# Patient Record
Sex: Male | Born: 1985 | Race: Black or African American | Hispanic: No | Marital: Single | Smoking: Never smoker
Health system: Southern US, Community
[De-identification: ages and names within clinical notes are randomized; demographics above are authoritative.]

## PROBLEM LIST (undated history)

## (undated) ENCOUNTER — Ambulatory Visit: Discharge status: Absent without leave | Payer: 59

## (undated) HISTORY — PX: WISDOM TOOTH EXTRACTION: SHX21

## (undated) HISTORY — PX: ARTHROSCOPIC REPAIR ACL: SUR80

---

## 2016-03-23 DIAGNOSIS — G510 Bell's palsy: Secondary | ICD-10-CM

## 2016-03-23 HISTORY — DX: Bell's palsy: G51.0

## 2017-01-08 DIAGNOSIS — M545 Low back pain: Secondary | ICD-10-CM | POA: Diagnosis not present

## 2017-01-08 DIAGNOSIS — M5432 Sciatica, left side: Secondary | ICD-10-CM | POA: Diagnosis not present

## 2017-06-05 ENCOUNTER — Ambulatory Visit (HOSPITAL_COMMUNITY)
Admission: EM | Admit: 2017-06-05 | Discharge: 2017-06-05 | Disposition: A | Discharge status: Home / Self Care | Payer: 59 | Attending: Family Medicine | Admitting: Family Medicine

## 2017-06-05 ENCOUNTER — Other Ambulatory Visit: Payer: Self-pay

## 2017-06-05 ENCOUNTER — Encounter (HOSPITAL_COMMUNITY): Payer: Self-pay

## 2017-06-05 DIAGNOSIS — J029 Acute pharyngitis, unspecified: Secondary | ICD-10-CM

## 2017-06-05 DIAGNOSIS — R509 Fever, unspecified: Secondary | ICD-10-CM | POA: Diagnosis present

## 2017-06-05 LAB — POCT RAPID STREP A: STREPTOCOCCUS, GROUP A SCREEN (DIRECT): NEGATIVE

## 2017-06-05 MED ORDER — ACETAMINOPHEN 325 MG PO TABS: 650.0000 mg | ORAL_TABLET | Freq: Once | ORAL | Status: DC

## 2017-06-05 MED ORDER — AMOXICILLIN 875 MG PO TABS: 875.0000 mg | ORAL_TABLET | Freq: Two times a day (BID) | ORAL | 0 refills | Status: DC

## 2017-06-05 NOTE — Discharge Instructions (Signed)
Please follow up if you are not seeing improvement within the next few days.  You may use over the counter ibuprofen or acetaminophen as needed.

## 2017-06-05 NOTE — ED Triage Notes (Signed)
Pt presents today with fever, chills, body aches, sinus pressure, sore throat that has been going on for 8 days. Has tried OTC that has not helped much with sxs.

## 2017-06-08 ENCOUNTER — Encounter (HOSPITAL_COMMUNITY): Payer: Self-pay | Admitting: Emergency Medicine

## 2017-06-08 ENCOUNTER — Ambulatory Visit (HOSPITAL_COMMUNITY)
Admission: EM | Admit: 2017-06-08 | Discharge: 2017-06-08 | Disposition: A | Discharge status: Home / Self Care | Payer: 59 | Attending: Family Medicine | Admitting: Family Medicine

## 2017-06-08 DIAGNOSIS — B370 Candidal stomatitis: Secondary | ICD-10-CM | POA: Diagnosis not present

## 2017-06-08 DIAGNOSIS — K0510 Chronic gingivitis, plaque induced: Secondary | ICD-10-CM

## 2017-06-08 LAB — CULTURE, GROUP A STREP (THRC)

## 2017-06-08 MED ORDER — HYDROCODONE-ACETAMINOPHEN 5-325 MG PO TABS: 1.0000 | ORAL_TABLET | Freq: Four times a day (QID) | ORAL | 0 refills | Status: DC | PRN

## 2017-06-08 MED ORDER — CHLORHEXIDINE GLUCONATE 0.12 % MT SOLN: 15.0000 mL | Freq: Two times a day (BID) | OROMUCOSAL | 0 refills | Status: DC

## 2017-06-08 MED ORDER — FLUCONAZOLE 150 MG PO TABS: 150.0000 mg | ORAL_TABLET | Freq: Once | ORAL | 0 refills | Status: AC

## 2017-06-08 NOTE — ED Provider Notes (Signed)
[facility]   [id number] 06/08/17 Arrival Time: 1808   SUBJECTIVE:  William Newton is a 32 y.o. male who presents to the urgent care with complaint of mouth sores.  History reviewed. No pertinent past medical history. History reviewed. No pertinent family history. Social History   Socioeconomic History  . Marital status: Single    Spouse name: Not on file  . Number of children: Not on file  . Years of education: Not on file  . Highest education level: Not on file  Social Needs  . Financial resource strain: Not on file  . Food insecurity - worry: Not on file  . Food insecurity - inability: Not on file  . Transportation needs - medical: Not on file  . Transportation needs - non-medical: Not on file  Occupational History  . Not on file  Tobacco Use  . Smoking status: Never Smoker  . Smokeless tobacco: Never Used  Substance and Sexual Activity  . Alcohol use: No    Frequency: Never  . Drug use: No  . Sexual activity: Not on file  Other Topics Concern  . Not on file  Social History Narrative  . Not on file   No outpatient medications have been marked as taking for the 06/08/17 encounter [facility] Encounter).   No Known Allergies    ROS: As per HPI, remainder of ROS negative.   OBJECTIVE:   Vitals:   06/08/17 1847  BP: 130/87  Pulse: 91  Resp: 18  Temp: 99.7 F (37.6 C)  TempSrc: Oral  SpO2: 99%     General appearance: alert; no distress Eyes: PERRL; EOMI; conjunctiva normal HENT: normocephalic; atraumatic; TMs normal, canal normal, external ears normal without trauma; nasal mucosa normal; oral mucosa diffuse mucosal injection at gum line of teeth.  Palate also very red. Neck: supple Back: no CVA tenderness Extremities: no cyanosis or edema; symmetrical with no gross deformities Skin: warm and dry Neurologic: normal gait; grossly normal Psychological: alert and cooperative; normal mood and affect      Labs:  Results for orders  placed or performed during the hospital encounter of 06/05/17  Culture, group A strep  Result Value Ref Range   Specimen Description THROAT    Special Requests NONE    Culture      NO GROUP A STREP (S.PYOGENES) ISOLATED Performed at [facility], [address], [place], [place] [address]    Report Status 06/08/2017 FINAL   POCT rapid strep A [facility])  Result Value Ref Range   Streptococcus, Group A Screen (Direct) NEGATIVE NEGATIVE    Labs Reviewed - No data to display  No results found.     ASSESSMENT & PLAN:  1. Gingivitis   2. Thrush     Meds ordered this encounter  Medications  . fluconazole (DIFLUCAN) 150 MG tablet    Sig: Take 1 tablet (150 mg total) by mouth once for 1 dose. Repeat if needed    Dispense:  2 tablet    Refill:  0  . chlorhexidine (PERIDEX) 0.12 % solution    Sig: Use as directed 15 mLs in the mouth or throat 2 (two) times daily.    Dispense:  120 mL    Refill:  0  . HYDROcodone-acetaminophen (NORCO) 5-325 MG tablet    Sig: Take 1 tablet by mouth every 6 (six) hours as needed for moderate pain.    Dispense:  12 tablet    Refill:  0  Reviewed expectations re: course of current medical issues. Questions answered. Outlined signs and symptoms indicating need for more acute intervention. Patient verbalized understanding. After Visit Summary given.    Procedures:      Elvina SidleLauenstein, Stefany Starace, MD 06/08/17 1859

## 2017-06-08 NOTE — ED Triage Notes (Signed)
Pt seen here for strep last week now sts sores in mouth

## 2017-06-09 NOTE — ED Provider Notes (Signed)
  [facility]   [id number] 06/05/17 Arrival Time: 1758  ASSESSMENT & PLAN:  1. Sore throat   2. Fever and chills     Meds ordered this encounter  Medications  . acetaminophen (TYLENOL) tablet 650 mg  . amoxicillin (AMOXIL) 875 MG tablet    Sig: Take 1 tablet (875 mg total) by mouth 2 (two) times daily for 10 days.    Dispense:  20 tablet    Refill:  0   Rapid strep negative. Culture sent. Suspicion for strep; will treat.  Labs Reviewed  CULTURE, GROUP A STREP [facility])  POCT RAPID STREP A   OTC analgesics and throat care as needed  Instructed to finish full 10 day course of antibiotics. Will follow up if not showing significant improvement over the next 24-48 hours.  Reviewed expectations re: course of current medical issues. Questions answered. Outlined signs and symptoms indicating need for more acute intervention. Patient verbalized understanding. After Visit Summary given.   SUBJECTIVE:  William Newton is a 32 y.o. male who reports a sore throat. Describes as pain with swallowing. Onset abrupt beginning several days ago. No respiratory symptoms. Normal PO intake but reports discomfort with swallowing. Fever reported: yes, subjective. No associated n/v/abdominal symptoms. Sick contacts: none. Also with sinus pressure for the past 7-10 days.  OTC treatment: analgesics without much relief.  ROS: As per HPI.   OBJECTIVE:  Vitals:   06/05/17 1849  BP: 135/72  Pulse: (!) 102  Resp: 16  Temp: (!) 101.5 F (38.6 C)  TempSrc: Oral  SpO2: 96%    Febrile. General appearance: alert; no distress HEENT: throat with moderate erythema; uvula midline Neck: supple with FROM;  Small cervical LAD, tender Lungs: clear to auscultation bilaterally Skin: reveals no rash; warm and dry Psychological: alert and cooperative; normal mood and affect  No Known Allergies   Social History   Socioeconomic History  . Marital status: Single    Spouse name: Not on file    . Number of children: Not on file  . Years of education: Not on file  . Highest education level: Not on file  Social Needs  . Financial resource strain: Not on file  . Food insecurity - worry: Not on file  . Food insecurity - inability: Not on file  . Transportation needs - medical: Not on file  . Transportation needs - non-medical: Not on file  Occupational History  . Not on file  Tobacco Use  . Smoking status: Never Smoker  . Smokeless tobacco: Never Used  Substance and Sexual Activity  . Alcohol use: No    Frequency: Never  . Drug use: No  . Sexual activity: Not on file  Other Topics Concern  . Not on file  Social History Narrative  . Not on file           Mardella Layman, MD 06/09/17 1010

## 2019-03-15 ENCOUNTER — Ambulatory Visit
Admission: EM | Admit: 2019-03-15 | Discharge: 2019-03-15 | Disposition: A | Discharge status: Home / Self Care | Payer: 59 | Attending: Emergency Medicine | Admitting: Emergency Medicine

## 2019-03-15 ENCOUNTER — Other Ambulatory Visit: Payer: Self-pay

## 2019-03-15 ENCOUNTER — Encounter: Payer: Self-pay | Admitting: Emergency Medicine

## 2019-03-15 DIAGNOSIS — S0501XA Injury of conjunctiva and corneal abrasion without foreign body, right eye, initial encounter: Secondary | ICD-10-CM | POA: Diagnosis not present

## 2019-03-15 DIAGNOSIS — Z973 Presence of spectacles and contact lenses: Secondary | ICD-10-CM | POA: Diagnosis not present

## 2019-03-15 MED ORDER — CIPROFLOXACIN HCL 0.3 % OP SOLN: 1.0000 [drp] | OPHTHALMIC | 0 refills | Status: AC

## 2019-03-15 NOTE — ED Provider Notes (Signed)
[facility]    CSN: [id number] Arrival date & time: 03/15/19  1120      History   Chief Complaint Chief Complaint  Patient presents with  . Eye Pain    HPI William Newton is a 33 y.o. male presenting for right eye pain, irritation, redness for the last few days, worse this morning.  Patient states he had some discharge that appeared to be green and kicked over his eye this morning.  Denies mucoid discharge outside of awakening.  Does endorse some clear, watery tearing, photophobia.  No change in vision.  Patient does wear contact lenses, does not currently have glasses.  Is not currently wearing contact lenses today.  Denies foreign body sensation, known foreign body exposure.  Dates that he changes his contact lenses nightly, follows appropriate contact lens hygiene.  Has not tried anything for symptoms.    History reviewed. No pertinent past medical history.  There are no problems to display for this patient.   Past Surgical History:  Procedure Laterality Date  . ARTHROSCOPIC REPAIR ACL    . WISDOM TOOTH EXTRACTION         Home Medications    Prior to Admission medications   Medication Sig Start Date End Date Taking? Authorizing Provider  ciprofloxacin (CILOXAN) 0.3 % ophthalmic solution Place 1 drop into the right eye every 2 (two) hours. Administer 1 drop, every 2 hours, while awake, for 2 days. Then 1 drop, every 4 hours, while awake, for the next 5 days. 03/15/19   Hall-Potvin, [place], PA-C    Family History Family History  Problem Relation Age of Onset  . Healthy Mother   . Sarcoidosis Father     Social History Social History   Tobacco Use  . Smoking status: Never Smoker  . Smokeless tobacco: Never Used  Substance Use Topics  . Alcohol use: No  . Drug use: No     Allergies   Patient has no known allergies.   Review of Systems Review of Systems  Constitutional: Negative for fatigue and fever.  Eyes: Positive for photophobia,  pain, discharge and redness. Negative for itching and visual disturbance.  Respiratory: Negative for cough and shortness of breath.   Cardiovascular: Negative for chest pain and palpitations.  Gastrointestinal: Negative for abdominal pain, diarrhea and vomiting.  Musculoskeletal: Negative for arthralgias and myalgias.  Skin: Negative for rash and wound.  Neurological: Negative for speech difficulty and headaches.  All other systems reviewed and are negative.    Physical Exam Triage Vital Signs ED Triage Vitals [03/15/19 1129]  Enc Vitals Group     BP 123/82     Pulse Rate 77     Resp 18     Temp 97.6 F (36.4 C)     Temp Source Temporal     SpO2 95 %     Weight      Height      Head Circumference      Peak Flow      Pain Score 7     Pain Loc      Pain Edu?      Excl. in GC?    No data found.  Updated Vital Signs BP 123/82 (BP Location: Left Arm)   Pulse 77   Temp 97.6 F (36.4 C) (Temporal)   Resp 18   SpO2 95%   Visual Acuity Right Eye Distance:   Left Eye Distance:   Bilateral Distance:    Right Eye Near: R Near:  20/200 Left Eye Near:  L Near: 20/200 Bilateral Near:  20/200(Pt is not wearing his contacts at this time)  Physical Exam Constitutional:      General: He is not in acute distress. HENT:     Head: Normocephalic and atraumatic.  Eyes:     General: Lids are normal. Lids are everted, no foreign bodies appreciated. Gaze aligned appropriately. No visual field deficit or scleral icterus.       Right eye: Discharge present. No foreign body or hordeolum.        Left eye: No foreign body, discharge or hordeolum.     Extraocular Movements: Extraocular movements intact.     Right eye: No nystagmus.     Left eye: No nystagmus.     Conjunctiva/sclera:     Right eye: Right conjunctiva is injected. No chemosis, exudate or hemorrhage.    Left eye: Left conjunctiva is not injected. No chemosis, exudate or hemorrhage.    Pupils: Pupils are equal, round, and  reactive to light.     Comments: Right eye with clear watery discharge.  No mucoid discharge medial canthus.  Vision is poor, though uncorrected and bilaterally symmetric.  Fluorescein dye exam showing corneal abrasion over inferolateral aspect of cornea sparing iris.  No ulceration noted  Cardiovascular:     Rate and Rhythm: Normal rate.  Pulmonary:     Effort: Pulmonary effort is normal. No respiratory distress.     Breath sounds: No wheezing.  Skin:    Coloration: Skin is not jaundiced or pale.  Neurological:     Mental Status: He is alert and oriented to person, place, and time.      UC Treatments / Results  Labs (all labs ordered are listed, but only abnormal results are displayed) Labs Reviewed - No data to display  EKG   Radiology No results found.  Procedures Procedures (including critical care time)  Medications Ordered in UC Medications - No data to display  Initial Impression / Assessment and Plan / UC Course  I have reviewed the triage vital signs and the nursing notes.  Pertinent labs & imaging results that were available during my care of the patient were reviewed by me and considered in my medical decision making (see chart for details).     Patient afebrile, nontoxic.  Exam revealing corneal abrasion, no obvious ulcer.  Giving patient uses contact lenses will cover with ciprofloxacin.  Patient to follow-up with ophthalmology Monday given holiday weekend, go to ER sooner for worsening symptoms.  Return precautions discussed, patient verbalized understanding and is agreeable to plan. Final Clinical Impressions(s) / UC Diagnoses   Final diagnoses:  Abrasion of right cornea, initial encounter  Wears contact lenses     Discharge Instructions     Please read instructions on eyedrop box when you pick up from the pharmacy. Be sure to not let dropper hit your eye or eyelashes as this will contaminate the antibiotic. Very important follow-up with  ophthalmology within 1 week or sooner if you develop worsening pain, fever, decreased vision.    ED Prescriptions    Medication Sig Dispense Auth. Provider   ciprofloxacin (CILOXAN) 0.3 % ophthalmic solution Place 1 drop into the right eye every 2 (two) hours. Administer 1 drop, every 2 hours, while awake, for 2 days. Then 1 drop, every 4 hours, while awake, for the next 5 days. 5 mL Hall-Potvin, [place], PA-C     PDMP not reviewed this encounter.   Hall-Potvin, [place], [place] 03/16/19 1621

## 2019-03-15 NOTE — Discharge Instructions (Addendum)
Please read instructions on eyedrop box when you pick up from the pharmacy. Be sure to not let dropper hit your eye or eyelashes as this will contaminate the antibiotic. Very important follow-up with ophthalmology within 1 week or sooner if you develop worsening pain, fever, decreased vision.

## 2019-03-15 NOTE — ED Triage Notes (Signed)
Pt presents to [facility] for assessment of right eye pain and irritation, worsening this morning with green caked on discharge, watering, and sensitivity to light.

## 2019-08-01 ENCOUNTER — Other Ambulatory Visit: Payer: Self-pay

## 2019-08-02 ENCOUNTER — Encounter: Payer: Self-pay | Admitting: Family Medicine

## 2019-08-02 ENCOUNTER — Ambulatory Visit (INDEPENDENT_AMBULATORY_CARE_PROVIDER_SITE_OTHER): Payer: 59 | Admitting: Family Medicine

## 2019-08-02 VITALS — BP 126/78 | HR 77 | Temp 97.4°F | Ht 70.0 in | Wt 226.2 lb

## 2019-08-02 DIAGNOSIS — Z0000 Encounter for general adult medical examination without abnormal findings: Secondary | ICD-10-CM

## 2019-08-02 DIAGNOSIS — Z8669 Personal history of other diseases of the nervous system and sense organs: Secondary | ICD-10-CM | POA: Diagnosis not present

## 2019-08-02 DIAGNOSIS — G4700 Insomnia, unspecified: Secondary | ICD-10-CM | POA: Diagnosis not present

## 2019-08-02 LAB — URINALYSIS, ROUTINE W REFLEX MICROSCOPIC
Bilirubin Urine: NEGATIVE
Hgb urine dipstick: NEGATIVE
Ketones, ur: NEGATIVE
Leukocytes,Ua: NEGATIVE
Nitrite: NEGATIVE
RBC / HPF: NONE SEEN (ref 0–?)
Specific Gravity, Urine: 1.025 (ref 1.000–1.030)
Total Protein, Urine: NEGATIVE
Urine Glucose: NEGATIVE
Urobilinogen, UA: 0.2 (ref 0.0–1.0)
WBC, UA: NONE SEEN (ref 0–?)
pH: 6 (ref 5.0–8.0)

## 2019-08-02 LAB — LIPID PANEL
Cholesterol: 152 mg/dL (ref 0–200)
HDL: 50.7 mg/dL (ref >39.00)
LDL Cholesterol: 93 mg/dL (ref 0–99)
NonHDL: 101.17
Total CHOL/HDL Ratio: 3
Triglycerides: 43 mg/dL (ref 0.0–149.0)
VLDL: 8.6 mg/dL (ref 0.0–40.0)

## 2019-08-02 LAB — CBC
HCT: 40.5 % (ref 39.0–52.0)
Hemoglobin: 13.7 g/dL (ref 13.0–17.0)
MCHC: 34 g/dL (ref 30.0–36.0)
MCV: 90.4 fl (ref 78.0–100.0)
Platelets: 217 10*3/uL (ref 150.0–400.0)
RBC: 4.48 Mil/uL (ref 4.22–5.81)
RDW: 12.8 % (ref 11.5–15.5)
WBC: 4.7 10*3/uL (ref 4.0–10.5)

## 2019-08-02 LAB — COMPREHENSIVE METABOLIC PANEL
ALT: 19 U/L (ref 0–53)
AST: 25 U/L (ref 0–37)
Albumin: 4.2 g/dL (ref 3.5–5.2)
Alkaline Phosphatase: 46 U/L (ref 39–117)
BUN: 15 mg/dL (ref 6–23)
CO2: 26 mEq/L (ref 19–32)
Calcium: 8.7 mg/dL (ref 8.4–10.5)
Chloride: 104 mEq/L (ref 96–112)
Creatinine, Ser: 1.06 mg/dL (ref 0.40–1.50)
GFR: 97.49 mL/min (ref >60.00)
Glucose, Bld: 106 mg/dL — ABNORMAL HIGH (ref 70–99)
Potassium: 4.2 mEq/L (ref 3.5–5.1)
Sodium: 135 mEq/L (ref 135–145)
Total Bilirubin: 0.5 mg/dL (ref 0.2–1.2)
Total Protein: 7.1 g/dL (ref 6.0–8.3)

## 2019-08-02 NOTE — Progress Notes (Signed)
New Patient Office Visit  Subjective:  Patient ID: William Newton, male    DOB: 1985/12/09  Age: 34 y.o. MRN: [id number]  CC:  Chief Complaint  Patient presents with  . Establish Care    New patient, no concerns fasting for labs.     HPI William Newton presents for establishment of care and a complete physical exam.  He is seeking follow-up for Bell's palsy that had affected him a few years ago.  Seems to be some lingering deficit.  History of chronic sinus disease and allergy rhinitis.  He has sought her ENT referral on his own.  He has dental care planned as well.  Has been reluctant to receive the Covid vaccine but is more interested in my opinion of it.  He experiences occasional insomnia.  He does snore.  He feels rested in the morning and no one has reported apnea.  Past Medical History:  Diagnosis Date  . Bell's palsy 2018    Past Surgical History:  Procedure Laterality Date  . ARTHROSCOPIC REPAIR ACL    . WISDOM TOOTH EXTRACTION      Family History  Problem Relation Age of Onset  . Healthy Mother   . Sarcoidosis Father     Social History   Socioeconomic History  . Marital status: Single    Spouse name: Not on file  . Number of children: Not on file  . Years of education: Not on file  . Highest education level: Not on file  Occupational History  . Not on file  Tobacco Use  . Smoking status: Never Smoker  . Smokeless tobacco: Never Used  Substance and Sexual Activity  . Alcohol use: Yes    Alcohol/week: 2.0 standard drinks    Types: 1 Cans of beer, 1 Shots of liquor per week    Comment: social  . Drug use: No  . Sexual activity: Yes  Other Topics Concern  . Not on file  Social History Narrative  . Not on file   Social Determinants of Health   Financial Resource Strain:   . Difficulty of Paying Living Expenses:   Food Insecurity:   . Worried About Programme researcher, broadcasting/film/video in the Last Year:   . Barista in the Last Year:   Transportation Needs:    . Freight forwarder (Medical):   Marland Kitchen Lack of Transportation (Non-Medical):   Physical Activity:   . Days of Exercise per Week:   . Minutes of Exercise per Session:   Stress:   . Feeling of Stress :   Social Connections:   . Frequency of Communication with Friends and Family:   . Frequency of Social Gatherings with Friends and Family:   . Attends Religious Services:   . Active Member of Clubs or Organizations:   . Attends Banker Meetings:   Marland Kitchen Marital Status:   Intimate Partner Violence:   . Fear of Current or Ex-Partner:   . Emotionally Abused:   Marland Kitchen Physically Abused:   . Sexually Abused:     ROS Review of Systems  Constitutional: Negative.   HENT: Negative.   Eyes: Negative for photophobia and visual disturbance.  Respiratory: Negative.   Cardiovascular: Negative.   Gastrointestinal: Negative.   Endocrine: Negative for polyphagia and polyuria.  Genitourinary: Negative.   Musculoskeletal: Negative.   Allergic/Immunologic: Negative for immunocompromised state.  Neurological: Negative for light-headedness and headaches.  Hematological: Does not bruise/bleed easily.  Psychiatric/Behavioral: Negative.    Depression  screen PHQ 2/9 08/02/2019  Decreased Interest 0  Down, Depressed, Hopeless 0  PHQ - 2 Score 0    Objective:   Today's Vitals: BP 126/78   Pulse 77   Temp (!) 97.4 F (36.3 C) (Tympanic)   Ht 5\' 10"  (1.778 m)   Wt 226 lb 3.2 oz (102.6 kg)   SpO2 96%   BMI 32.46 kg/m   Physical Exam Vitals and nursing note reviewed.  Constitutional:      General: He is not in acute distress.    Appearance: Normal appearance. He is not ill-appearing, toxic-appearing or diaphoretic.  HENT:     Head: Normocephalic and atraumatic.     Right Ear: Tympanic membrane, ear canal and external ear normal.     Left Ear: Tympanic membrane, ear canal and external ear normal.     Mouth/Throat:     Mouth: Mucous membranes are moist.     Pharynx: Oropharynx is  clear. No oropharyngeal exudate.   Eyes:     General: No scleral icterus.       Right eye: No discharge.        Left eye: No discharge.     Extraocular Movements: Extraocular movements intact.     Conjunctiva/sclera: Conjunctivae normal.     Pupils: Pupils are equal, round, and reactive to light.  Cardiovascular:     Rate and Rhythm: Normal rate and regular rhythm.  Pulmonary:     Effort: Pulmonary effort is normal.     Breath sounds: Normal breath sounds.  Abdominal:     General: Abdomen is flat. Bowel sounds are normal. There is no distension.     Palpations: Abdomen is soft. There is no mass.     Tenderness: There is no abdominal tenderness. There is no guarding or rebound.     Hernia: No hernia is present. There is no hernia in the left inguinal area or right inguinal area.  Genitourinary:    Penis: Circumcised. No hypospadias, erythema, tenderness, swelling or lesions.      Testes:        Right: Mass, tenderness or swelling not present. Right testis is descended.        Left: Mass, tenderness or swelling not present. Left testis is descended.     Epididymis:     Right: Not inflamed or enlarged.     Left: Not inflamed or enlarged.  Musculoskeletal:     Cervical back: No rigidity or tenderness.     Right lower leg: No edema.     Left lower leg: No edema.  Lymphadenopathy:     Cervical: No cervical adenopathy.     Lower Body: No right inguinal adenopathy. No left inguinal adenopathy.  Skin:    General: Skin is warm and dry.  Neurological:     Mental Status: He is alert and oriented to person, place, and time.     Assessment & Plan:   Problem List Items Addressed This Visit      Other   History of Bell's palsy   Relevant Orders   Ambulatory referral to Neurology   Healthcare maintenance - Primary   Relevant Orders   CBC   Comprehensive metabolic panel   Lipid panel   HIV Antibody (routine testing w rflx)   Urinalysis, Routine w reflex microscopic   Insomnia       Outpatient Encounter Medications as of 08/02/2019  Medication Sig  . ciprofloxacin (CILOXAN) 0.3 % ophthalmic solution Place 1 drop into the right eye every  2 (two) hours. Administer 1 drop, every 2 hours, while awake, for 2 days. Then 1 drop, every 4 hours, while awake, for the next 5 days. (Patient not taking: Reported on 08/02/2019)   No facility-administered encounter medications on file as of 08/02/2019.    Follow-up: Return in about 1 year (around 08/01/2020), or if symptoms worsen or fail to improve.  Patient is likely to go for his Covid vaccine.  He was given information on health maintenance disease prevention and insomnia with sleep hygiene. Libby Maw, MD

## 2019-08-02 NOTE — Patient Instructions (Addendum)
Health Maintenance, Male Adopting a healthy lifestyle and getting preventive care are important in promoting health and wellness. Ask your health care provider about:  The right schedule for you to have regular tests and exams.  Things you can do on your own to prevent diseases and keep yourself healthy. What should I know about diet, weight, and exercise? Eat a healthy diet   Eat a diet that includes plenty of vegetables, fruits, low-fat dairy products, and lean protein.  Do not eat a lot of foods that are high in solid fats, added sugars, or sodium. Maintain a healthy weight Body mass index (BMI) is a measurement that can be used to identify possible weight problems. It estimates body fat based on height and weight. Your health care provider can help determine your BMI and help you achieve or maintain a healthy weight. Get regular exercise Get regular exercise. This is one of the most important things you can do for your health. Most adults should:  Exercise for at least 150 minutes each week. The exercise should increase your heart rate and make you sweat (moderate-intensity exercise).  Do strengthening exercises at least twice a week. This is in addition to the moderate-intensity exercise.  Spend less time sitting. Even light physical activity can be beneficial. Watch cholesterol and blood lipids Have your blood tested for lipids and cholesterol at 33 years of age, then have this test every 5 years. You may need to have your cholesterol levels checked more often if:  Your lipid or cholesterol levels are high.  You are older than 33 years of age.  You are at high risk for heart disease. What should I know about cancer screening? Many types of cancers can be detected early and may often be prevented. Depending on your health history and family history, you may need to have cancer screening at various ages. This may include screening for:  Colorectal cancer.  Prostate  cancer.  Skin cancer.  Lung cancer. What should I know about heart disease, diabetes, and high blood pressure? Blood pressure and heart disease  High blood pressure causes heart disease and increases the risk of stroke. This is more likely to develop in people who have high blood pressure readings, are of African descent, or are overweight.  Talk with your health care provider about your target blood pressure readings.  Have your blood pressure checked: ? Every 3-5 years if you are 18-39 years of age. ? Every year if you are 40 years old or older.  If you are between the ages of 65 and 75 and are a current or former smoker, ask your health care provider if you should have a one-time screening for abdominal aortic aneurysm (AAA). Diabetes Have regular diabetes screenings. This checks your fasting blood sugar level. Have the screening done:  Once every three years after age 45 if you are at a normal weight and have a low risk for diabetes.  More often and at a younger age if you are overweight or have a high risk for diabetes. What should I know about preventing infection? Hepatitis B If you have a higher risk for hepatitis B, you should be screened for this virus. Talk with your health care provider to find out if you are at risk for hepatitis B infection. Hepatitis C Blood testing is recommended for:  Everyone born from 1945 through 1965.  Anyone with known risk factors for hepatitis C. Sexually transmitted infections (STIs)  You should be screened each year   for STIs, including gonorrhea and chlamydia, if: ? You are sexually active and are younger than 33 years of age. ? You are older than 33 years of age and your health care provider tells you that you are at risk for this type of infection. ? Your sexual activity has changed since you were last screened, and you are at increased risk for chlamydia or gonorrhea. Ask your health care provider if you are at risk.  Ask your  health care provider about whether you are at high risk for HIV. Your health care provider may recommend a prescription medicine to help prevent HIV infection. If you choose to take medicine to prevent HIV, you should first get tested for HIV. You should then be tested every 3 months for as long as you are taking the medicine. Follow these instructions at home: Lifestyle  Do not use any products that contain nicotine or tobacco, such as cigarettes, e-cigarettes, and chewing tobacco. If you need help quitting, ask your health care provider.  Do not use street drugs.  Do not share needles.  Ask your health care provider for help if you need support or information about quitting drugs. Alcohol use  Do not drink alcohol if your health care provider tells you not to drink.  If you drink alcohol: ? Limit how much you have to 0-2 drinks a day. ? Be aware of how much alcohol is in your drink. In the [place] one drink equals one 12 oz bottle of beer (355 mL), one 5 oz glass of wine (148 mL), or one 1 oz glass of hard liquor (44 mL). General instructions  Schedule regular health, dental, and eye exams.  Stay current with your vaccines.  Tell your health care provider if: ? You often feel depressed. ? You have ever been abused or do not feel safe at home. Summary  Adopting a healthy lifestyle and getting preventive care are important in promoting health and wellness.  Follow your health care provider's instructions about healthy diet, exercising, and getting tested or screened for diseases.  Follow your health care provider's instructions on monitoring your cholesterol and blood pressure. This information is not intended to replace advice given to you by your health care provider. Make sure you discuss any questions you have with your health care provider. Document Revised: 03/02/2018 Document Reviewed: 03/02/2018 Elsevier Patient Education  2020 [employer] Inc[employer]  Preventive Care 21-39 Years  Old, Male Preventive care refers to lifestyle choices and visits with your health care provider that can promote health and wellness. This includes:  A yearly physical exam. This is also called an annual well check.  Regular dental and eye exams.  Immunizations.  Screening for certain conditions.  Healthy lifestyle choices, such as eating a healthy diet, getting regular exercise, not using drugs or products that contain nicotine and tobacco, and limiting alcohol use. What can I expect for my preventive care visit? Physical exam Your health care provider will check:  Height and weight. These may be used to calculate body mass index (BMI), which is a measurement that tells if you are at a healthy weight.  Heart rate and blood pressure.  Your skin for abnormal spots. Counseling Your health care provider may ask you questions about:  Alcohol, tobacco, and drug use.  Emotional well-being.  Home and relationship well-being.  Sexual activity.  Eating habits.  Work and work environment. What immunizations do I need?  Influenza (flu) vaccine  This is recommended every year. Tetanus, diphtheria,   and pertussis (Tdap) vaccine  You may need a Td booster every 10 years. Varicella (chickenpox) vaccine  You may need this vaccine if you have not already been vaccinated. Human papillomavirus (HPV) vaccine  If recommended by your health care provider, you may need three doses over 6 months. Measles, mumps, and rubella (MMR) vaccine  You may need at least one dose of MMR. You may also need a second dose. Meningococcal conjugate (MenACWY) vaccine  One dose is recommended if you are 73-28 years old and a Market researcher living in a residence hall, or if you have one of several medical conditions. You may also need additional booster doses. Pneumococcal conjugate (PCV13) vaccine  You may need this if you have certain conditions and were not previously  vaccinated. Pneumococcal polysaccharide (PPSV23) vaccine  You may need one or two doses if you smoke cigarettes or if you have certain conditions. Hepatitis A vaccine  You may need this if you have certain conditions or if you travel or work in places where you may be exposed to hepatitis A. Hepatitis B vaccine  You may need this if you have certain conditions or if you travel or work in places where you may be exposed to hepatitis B. Haemophilus influenzae type b (Hib) vaccine  You may need this if you have certain risk factors. You may receive vaccines as individual doses or as more than one vaccine together in one shot (combination vaccines). Talk with your health care provider about the risks and benefits of combination vaccines. What tests do I need? Blood tests  Lipid and cholesterol levels. These may be checked every 5 years starting at age 80.  Hepatitis C test.  Hepatitis B test. Screening   Diabetes screening. This is done by checking your blood sugar (glucose) after you have not eaten for a while (fasting).  Sexually transmitted disease (STD) testing. Talk with your health care provider about your test results, treatment options, and if necessary, the need for more tests. Follow these instructions at home: Eating and drinking   Eat a diet that includes fresh fruits and vegetables, whole grains, lean protein, and low-fat dairy products.  Take vitamin and mineral supplements as recommended by your health care provider.  Do not drink alcohol if your health care provider tells you not to drink.  If you drink alcohol: ? Limit how much you have to 0-2 drinks a day. ? Be aware of how much alcohol is in your drink. In the [place] one drink equals one 12 oz bottle of beer (355 mL), one 5 oz glass of wine (148 mL), or one 1 oz glass of hard liquor (44 mL). Lifestyle  Take daily care of your teeth and gums.  Stay active. Exercise for at least 30 minutes on 5 or more days  each week.  Do not use any products that contain nicotine or tobacco, such as cigarettes, e-cigarettes, and chewing tobacco. If you need help quitting, ask your health care provider.  If you are sexually active, practice safe sex. Use a condom or other form of protection to prevent STIs (sexually transmitted infections). What's next?  Go to your health care provider once a year for a well check visit.  Ask your health care provider how often you should have your eyes and teeth checked.  Stay up to date on all vaccines. This information is not intended to replace advice given to you by your health care provider. Make sure you discuss any questions you  have with your health care provider. Document Revised: 03/03/2018 Document Reviewed: 03/03/2018 Elsevier Patient Education  [place].  Insomnia Insomnia is a sleep disorder that makes it difficult to fall asleep or stay asleep. Insomnia can cause fatigue, low energy, difficulty concentrating, mood swings, and poor performance at work or school. There are three different ways to classify insomnia:  Difficulty falling asleep.  Difficulty staying asleep.  Waking up too early in the morning. Any type of insomnia can be long-term (chronic) or short-term (acute). Both are common. Short-term insomnia usually lasts for three months or less. Chronic insomnia occurs at least three times a week for longer than three months. What are the causes? Insomnia may be caused by another condition, situation, or substance, such as:  Anxiety.  Certain medicines.  Gastroesophageal reflux disease (GERD) or other gastrointestinal conditions.  Asthma or other breathing conditions.  Restless legs syndrome, sleep apnea, or other sleep disorders.  Chronic pain.  Menopause.  Stroke.  Abuse of alcohol, tobacco, or illegal drugs.  Mental health conditions, such as depression.  Caffeine.  Neurological disorders, such as Alzheimer's  disease.  An overactive thyroid (hyperthyroidism). Sometimes, the cause of insomnia may not be known. What increases the risk? Risk factors for insomnia include:  Gender. Women are affected more often than men.  Age. Insomnia is more common as you get older.  Stress.  Lack of exercise.  Irregular work schedule or working night shifts.  Traveling between different time zones.  Certain medical and mental health conditions. What are the signs or symptoms? If you have insomnia, the main symptom is having trouble falling asleep or having trouble staying asleep. This may lead to other symptoms, such as:  Feeling fatigued or having low energy.  Feeling nervous about going to sleep.  Not feeling rested in the morning.  Having trouble concentrating.  Feeling irritable, anxious, or depressed. How is this diagnosed? This condition may be diagnosed based on:  Your symptoms and medical history. Your health care provider may ask about: ? Your sleep habits. ? Any medical conditions you have. ? Your mental health.  A physical exam. How is this treated? Treatment for insomnia depends on the cause. Treatment may focus on treating an underlying condition that is causing insomnia. Treatment may also include:  Medicines to help you sleep.  Counseling or therapy.  Lifestyle adjustments to help you sleep better. Follow these instructions at home: Eating and drinking   Limit or avoid alcohol, caffeinated beverages, and cigarettes, especially close to bedtime. These can disrupt your sleep.  Do not eat a large meal or eat spicy foods right before bedtime. This can lead to digestive discomfort that can make it hard for you to sleep. Sleep habits   Keep a sleep diary to help you and your health care provider figure out what could be causing your insomnia. Write down: ? When you sleep. ? When you wake up during the night. ? How well you sleep. ? How rested you feel the next  day. ? Any side effects of medicines you are taking. ? What you eat and drink.  Make your bedroom a dark, comfortable place where it is easy to fall asleep. ? Put up shades or blackout curtains to block light from outside. ? Use a white noise machine to block noise. ? Keep the temperature cool.  Limit screen use before bedtime. This includes: ? Watching TV. ? Using your smartphone, tablet, or computer.  Stick to a routine that includes going  to bed and waking up at the same times every day and night. This can help you fall asleep faster. Consider making a quiet activity, such as reading, part of your nighttime routine.  Try to avoid taking naps during the day so that you sleep better at night.  Get out of bed if you are still awake after 15 minutes of trying to sleep. Keep the lights down, but try reading or doing a quiet activity. When you feel sleepy, go back to bed. General instructions  Take over-the-counter and prescription medicines only as told by your health care provider.  Exercise regularly, as told by your health care provider. Avoid exercise starting several hours before bedtime.  Use relaxation techniques to manage stress. Ask your health care provider to suggest some techniques that may work well for you. These may include: ? Breathing exercises. ? Routines to release muscle tension. ? Visualizing peaceful scenes.  Make sure that you drive carefully. Avoid driving if you feel very sleepy.  Keep all follow-up visits as told by your health care provider. This is important. Contact a health care provider if:  You are tired throughout the day.  You have trouble in your daily routine due to sleepiness.  You continue to have sleep problems, or your sleep problems get worse. Get help right away if:  You have serious thoughts about hurting yourself or someone else. If you ever feel like you may hurt yourself or others, or have thoughts about taking your own life, get  help right away. You can go to your nearest emergency department or call:  Your local emergency services (911 in the [place]  A suicide crisis helpline, such as the [facility] at 3071205241. This is open 24 hours a day. Summary  Insomnia is a sleep disorder that makes it difficult to fall asleep or stay asleep.  Insomnia can be long-term (chronic) or short-term (acute).  Treatment for insomnia depends on the cause. Treatment may focus on treating an underlying condition that is causing insomnia.  Keep a sleep diary to help you and your health care provider figure out what could be causing your insomnia. This information is not intended to replace advice given to you by your health care provider. Make sure you discuss any questions you have with your health care provider. Document Revised: 02/19/2017 Document Reviewed: 12/17/2016 Elsevier Patient Education  2020 [employer].

## 2019-08-03 ENCOUNTER — Encounter: Payer: Self-pay | Admitting: Neurology

## 2019-08-03 LAB — HIV ANTIBODY (ROUTINE TESTING W REFLEX): HIV 1&2 Ab, 4th Generation: NONREACTIVE

## 2019-08-10 ENCOUNTER — Ambulatory Visit (INDEPENDENT_AMBULATORY_CARE_PROVIDER_SITE_OTHER): Payer: 59 | Admitting: Otolaryngology

## 2019-08-10 ENCOUNTER — Other Ambulatory Visit: Payer: Self-pay

## 2019-08-10 ENCOUNTER — Encounter (INDEPENDENT_AMBULATORY_CARE_PROVIDER_SITE_OTHER): Payer: Self-pay | Admitting: Otolaryngology

## 2019-08-10 VITALS — Temp 97.7°F

## 2019-08-10 DIAGNOSIS — J3489 Other specified disorders of nose and nasal sinuses: Secondary | ICD-10-CM | POA: Diagnosis not present

## 2019-08-10 DIAGNOSIS — J310 Chronic rhinitis: Secondary | ICD-10-CM | POA: Diagnosis not present

## 2019-08-10 NOTE — Progress Notes (Signed)
HPI: William Newton is a 34 y.o. male who presents for evaluation of nasal sinus issues.  He has always had trouble breathing through his nose.  He has had history of sinus problems in the past.  They discussed possible surgery with him in the past.  He states that he has frequent sinus infections.  He has used nasal steroid sprays intermittently. He does not smoke. He does complain of some postnasal drainage.  Past Medical History:  Diagnosis Date  . Bell's palsy 2018   Past Surgical History:  Procedure Laterality Date  . ARTHROSCOPIC REPAIR ACL    . WISDOM TOOTH EXTRACTION     Social History   Socioeconomic History  . Marital status: Single    Spouse name: Not on file  . Number of children: Not on file  . Years of education: Not on file  . Highest education level: Not on file  Occupational History  . Not on file  Tobacco Use  . Smoking status: Never Smoker  . Smokeless tobacco: Never Used  Substance and Sexual Activity  . Alcohol use: Yes    Alcohol/week: 2.0 standard drinks    Types: 1 Cans of beer, 1 Shots of liquor per week    Comment: social  . Drug use: No  . Sexual activity: Yes  Other Topics Concern  . Not on file  Social History Narrative  . Not on file   Social Determinants of Health   Financial Resource Strain:   . Difficulty of Paying Living Expenses:   Food Insecurity:   . Worried About Charity fundraiser in the Last Year:   . Arboriculturist in the Last Year:   Transportation Needs:   . Film/video editor (Medical):   Marland Kitchen Lack of Transportation (Non-Medical):   Physical Activity:   . Days of Exercise per Week:   . Minutes of Exercise per Session:   Stress:   . Feeling of Stress :   Social Connections:   . Frequency of Communication with Friends and Family:   . Frequency of Social Gatherings with Friends and Family:   . Attends Religious Services:   . Active Member of Clubs or Organizations:   . Attends Archivist Meetings:   Marland Kitchen  Marital Status:    Family History  Problem Relation Age of Onset  . Healthy Mother   . Sarcoidosis Father    No Known Allergies Prior to Admission medications   Medication Sig Start Date End Date Taking? Authorizing Provider  ciprofloxacin (CILOXAN) 0.3 % ophthalmic solution Place 1 drop into the right eye every 2 (two) hours. Administer 1 drop, every 2 hours, while awake, for 2 days. Then 1 drop, every 4 hours, while awake, for the next 5 days. 03/15/19  Yes Hall-Potvin, [place], PA-C     Positive ROS: Otherwise negative  All other systems have been reviewed and were otherwise negative with the exception of those mentioned in the HPI and as above.  Physical Exam: Constitutional: Alert, well-appearing, no acute distress Ears: External ears without lesions or tenderness. Ear canals are clear bilaterally with intact, clear TMs bilaterally. Nasal: External nose without lesions. Septum with mild deformity and moderate rhinitis.  He has a large turbinates bilaterally.  No obvious polyps noted..  Clinically both millimeters regions are clear.  He does describe pressure in the sinuses at times. Oral: Lips and gums without lesions. Tongue and palate mucosa without lesions. Posterior oropharynx clear. Neck: No palpable adenopathy or masses  Respiratory: Breathing comfortably  Skin: No facial/neck lesions or rash noted.  Procedures  Assessment: Chronic rhinitis. Questionable chronic sinus disease.  Plan: Recommended regular use of nasal steroid spray and prescribe either Nasacort or Flonase 2 sprays each nostril at night. We will plan on obtaining a CT scan of his sinuses and have him follow-up following the CT scan of the sinuses to discuss possible surgical options.  Narda Bonds, MD

## 2019-08-14 ENCOUNTER — Other Ambulatory Visit (INDEPENDENT_AMBULATORY_CARE_PROVIDER_SITE_OTHER): Payer: Self-pay

## 2019-08-14 DIAGNOSIS — J339 Nasal polyp, unspecified: Secondary | ICD-10-CM

## 2019-08-14 DIAGNOSIS — J329 Chronic sinusitis, unspecified: Secondary | ICD-10-CM

## 2019-08-30 ENCOUNTER — Ambulatory Visit
Admission: RE | Admit: 2019-08-30 | Discharge: 2019-08-30 | Disposition: A | Discharge status: Home / Self Care | Payer: 59 | Source: Ambulatory Visit | Attending: Otolaryngology | Admitting: Otolaryngology

## 2019-08-30 DIAGNOSIS — J329 Chronic sinusitis, unspecified: Secondary | ICD-10-CM

## 2019-08-30 DIAGNOSIS — J339 Nasal polyp, unspecified: Secondary | ICD-10-CM

## 2019-09-28 ENCOUNTER — Ambulatory Visit (INDEPENDENT_AMBULATORY_CARE_PROVIDER_SITE_OTHER): Payer: 59 | Admitting: Otolaryngology

## 2019-09-28 ENCOUNTER — Other Ambulatory Visit: Payer: Self-pay

## 2019-09-28 VITALS — Temp 97.5°F

## 2019-09-28 DIAGNOSIS — J342 Deviated nasal septum: Secondary | ICD-10-CM

## 2019-09-28 DIAGNOSIS — J343 Hypertrophy of nasal turbinates: Secondary | ICD-10-CM | POA: Diagnosis not present

## 2019-09-28 NOTE — Progress Notes (Signed)
HPI: William Newton is a 34 y.o. male who returns today for evaluation of sinuses and nasal congestion which he has had for years.  Today he feels more congested on the right side.  He presents today to review the CT scan that he had performed recently.  I reviewed the CT scan with him today which demonstrated only a mild septal deformity to the left.  He had moderate mucosal thickening in turbinates consistent with chronic rhinitis.  He had some mucous retention cyst within the maxillary sinuses but no air-fluid level and minimal mucoperiosteal thickening.  Frontal sinuses were clear sphenoid sinuses were clear ethmoid sinuses were clear.. Patient is otherwise healthy.  Past Medical History:  Diagnosis Date  . Bell's palsy 2018   Past Surgical History:  Procedure Laterality Date  . ARTHROSCOPIC REPAIR ACL    . WISDOM TOOTH EXTRACTION     Social History   Socioeconomic History  . Marital status: Single    Spouse name: Not on file  . Number of children: Not on file  . Years of education: Not on file  . Highest education level: Not on file  Occupational History  . Not on file  Tobacco Use  . Smoking status: Never Smoker  . Smokeless tobacco: Never Used  Vaping Use  . Vaping Use: Never used  Substance and Sexual Activity  . Alcohol use: Yes    Alcohol/week: 2.0 standard drinks    Types: 1 Cans of beer, 1 Shots of liquor per week    Comment: social  . Drug use: No  . Sexual activity: Yes  Other Topics Concern  . Not on file  Social History Narrative  . Not on file   Social Determinants of Health   Financial Resource Strain:   . Difficulty of Paying Living Expenses:   Food Insecurity:   . Worried About Programme researcher, broadcasting/film/video in the Last Year:   . Barista in the Last Year:   Transportation Needs:   . Freight forwarder (Medical):   Marland Kitchen Lack of Transportation (Non-Medical):   Physical Activity:   . Days of Exercise per Week:   . Minutes of Exercise per Session:    Stress:   . Feeling of Stress :   Social Connections:   . Frequency of Communication with Friends and Family:   . Frequency of Social Gatherings with Friends and Family:   . Attends Religious Services:   . Active Member of Clubs or Organizations:   . Attends Banker Meetings:   Marland Kitchen Marital Status:    Family History  Problem Relation Age of Onset  . Healthy Mother   . Sarcoidosis Father    No Known Allergies Prior to Admission medications   Medication Sig Start Date End Date Taking? Authorizing Provider  ciprofloxacin (CILOXAN) 0.3 % ophthalmic solution Place 1 drop into the right eye every 2 (two) hours. Administer 1 drop, every 2 hours, while awake, for 2 days. Then 1 drop, every 4 hours, while awake, for the next 5 days. 03/15/19  Yes Hall-Potvin, [place], PA-C     Positive ROS: Otherwise negative  All other systems have been reviewed and were otherwise negative with the exception of those mentioned in the HPI and as above.  Physical Exam: Constitutional: Alert, well-appearing, no acute distress Ears: External ears without lesions or tenderness. Ear canals are clear bilaterally with intact, clear TMs.  Nasal: External nose without lesions. Septum slightly deviated to the left with a  moderate rhinitis and mucosal thickening especially on the right side today.  No polyps noted.  Middle meatus regions were clear bilaterally.. Clear nasal passages Oral: Lips and gums without lesions. Tongue and palate mucosa without lesions. Posterior oropharynx clear. Neck: No palpable adenopathy or masses Respiratory: Breathing comfortably  Skin: No facial/neck lesions or rash noted.  Procedures  Assessment: Septal deformity with turbinate hypertrophy with intermittent nasal obstruction. Minimal sinus abnormalities noted on CT scan.  Plan: Reviewed with patient concerning septoplasty internal reductions to help improve his breathing if this becomes a chronic problem.  Initially  I would recommend regular use of Nasacort or Flonase.  He states that he has intermittent pressure in the sinuses but this is not a severe problem and is not sure about any surgical intervention on the sinuses.  I reviewed nasal sinus surgery with him in the office today.  Recommended regular use of nasal steroid spray he will call us back if he decides to pursue surgery.   Narda Bonds, MD

## 2019-10-18 NOTE — Progress Notes (Signed)
NEUROLOGY CONSULTATION NOTE  William Newton MRN: [id number] DOB: 1986-03-06  Referring provider: Nadene Rubins, MD Primary care provider: Nadene Rubins, MD  Reason for consult:  History of Bell's palsy  HISTORY OF PRESENT ILLNESS: William Newton is a 34 year old right-handed male who presents for history of right-sided Bell's palsy.  History supplemented by referring provider's note.  He was diagnosed with right-sided Bell's palsy about 2 years ago after having sinusitis and ear infection.  He was given lacrilube for the eye but not given prednisone or Valtrex.  Muscle strength has improved, but he still has some difficulty with blinking his eye, and he feels right sided facial tightness with he smiles or chews.  Sense of taste has returned.  08/02/2019 LABS:  CBC with WBC 4.7, HGB 13.7, HCT 40.5, PLT 217; CMP with Na 135, K 4.2, Cl 104, CO2 26, glucose 106, BUN 15, Cr 1.06, t bili 0.5, ALP 46, AST 25, ALT 19; HIV 1&2 antibody negative.  PAST MEDICAL HISTORY: Past Medical History:  Diagnosis Date  . Bell's palsy 2018    PAST SURGICAL HISTORY: Past Surgical History:  Procedure Laterality Date  . ARTHROSCOPIC REPAIR ACL    . WISDOM TOOTH EXTRACTION      MEDICATIONS: Current Outpatient Medications on File Prior to Visit  Medication Sig Dispense Refill  . ciprofloxacin (CILOXAN) 0.3 % ophthalmic solution Place 1 drop into the right eye every 2 (two) hours. Administer 1 drop, every 2 hours, while awake, for 2 days. Then 1 drop, every 4 hours, while awake, for the next 5 days. 5 mL 0   No current facility-administered medications on file prior to visit.    ALLERGIES: No Known Allergies  FAMILY HISTORY: Family History  Problem Relation Age of Onset  . Healthy Mother   . Sarcoidosis Father    SOCIAL HISTORY: Social History   Socioeconomic History  . Marital status: Single    Spouse name: Not on file  . Number of children: Not on file  . Years of education: Not on  file  . Highest education level: Not on file  Occupational History  . Not on file  Tobacco Use  . Smoking status: Never Smoker  . Smokeless tobacco: Never Used  Vaping Use  . Vaping Use: Never used  Substance and Sexual Activity  . Alcohol use: Yes    Alcohol/week: 2.0 standard drinks    Types: 1 Cans of beer, 1 Shots of liquor per week    Comment: social  . Drug use: No  . Sexual activity: Yes  Other Topics Concern  . Not on file  Social History Narrative  . Not on file   Social Determinants of Health   Financial Resource Strain:   . Difficulty of Paying Living Expenses:   Food Insecurity:   . Worried About Programme researcher, broadcasting/film/video in the Last Year:   . Barista in the Last Year:   Transportation Needs:   . Freight forwarder (Medical):   Marland Kitchen Lack of Transportation (Non-Medical):   Physical Activity:   . Days of Exercise per Week:   . Minutes of Exercise per Session:   Stress:   . Feeling of Stress :   Social Connections:   . Frequency of Communication with Friends and Family:   . Frequency of Social Gatherings with Friends and Family:   . Attends Religious Services:   . Active Member of Clubs or Organizations:   . Attends Club or  Organization Meetings:   Marland Kitchen Marital Status:   Intimate Partner Violence:   . Fear of Current or Ex-Partner:   . Emotionally Abused:   Marland Kitchen Physically Abused:   . Sexually Abused:     PHYSICAL EXAM: Blood pressure 120/77, pulse 74, height 5\' 11"  (1.803 m), weight (!) 232 lb (105.2 kg), SpO2 95 %.  General: No acute distress.  Patient appears well-groomed.   Head:  Normocephalic/atraumatic Eyes:  fundi examined but not visualized Neck: supple, no paraspinal tenderness, full range of motion Back: No paraspinal tenderness Heart: regular rate and rhythm Lungs: Clear to auscultation bilaterally. Vascular: No carotid bruits. Neurological Exam: Mental status: alert and oriented to person, place, and time, recent and remote memory  intact, fund of knowledge intact, attention and concentration intact, speech fluent and not dysarthric, language intact. Cranial nerves: CN I: not tested CN II: pupils equal, round and reactive to light, visual fields intact CN III, IV, VI:  full range of motion, no nystagmus, no ptosis CN V: facial sensation intact CN VII: right upper and lower facial weakness CN VIII: hearing intact CN IX, X: gag intact, uvula midline CN XI: sternocleidomastoid and trapezius muscles intact CN XII: tongue midline Bulk & Tone: normal, no fasciculations. Motor:  5/5 throughout  Sensation:  Temperature and vibration sensation intact.  Deep Tendon Reflexes:  2+ throughout, toes downgoing.  Finger to nose testing:  Without dysmetria.   Heel to shin:  Without dysmetria.   Gait:  Normal station and stride.  Able to turn and tandem walk. Romberg negative.  IMPRESSION: History of right-sided Bell's palsy with residual symptoms  PLAN: Refer to physical therapy Follow up as needed  Thank you for allowing me to take part in the care of this patient.  , DO  CC: Shon Millet, MD

## 2019-10-19 ENCOUNTER — Ambulatory Visit: Payer: 59 | Admitting: Neurology

## 2019-10-19 ENCOUNTER — Other Ambulatory Visit: Payer: Self-pay

## 2019-10-19 ENCOUNTER — Encounter: Payer: Self-pay | Admitting: Neurology

## 2019-10-19 VITALS — BP 120/77 | HR 74 | Ht 71.0 in | Wt 232.0 lb

## 2019-10-19 DIAGNOSIS — G510 Bell's palsy: Secondary | ICD-10-CM | POA: Diagnosis not present

## 2019-10-19 NOTE — Patient Instructions (Signed)
I will refer you to a therapist for treatment

## 2019-11-09 ENCOUNTER — Encounter: Payer: Self-pay | Admitting: Physical Therapy

## 2019-11-09 ENCOUNTER — Other Ambulatory Visit: Payer: Self-pay

## 2019-11-09 ENCOUNTER — Ambulatory Visit: Payer: 59 | Attending: Neurology | Admitting: Physical Therapy

## 2019-11-09 DIAGNOSIS — R29810 Facial weakness: Secondary | ICD-10-CM | POA: Diagnosis not present

## 2019-11-09 NOTE — Therapy (Signed)
[facility] [address] [place], [place], [address] Phone: [phone]   Fax:  [phone]  Physical Therapy Evaluation  Patient Details  Name: William Newton MRN: [id number] Date of Birth: 02-Feb-1986 Referring Provider (PT): Shon Millet, DO   Encounter Date: 11/09/2019   PT End of Session - 11/09/19 1209    Visit Number 1    Number of Visits 10    Date for PT Re-Evaluation 12/21/19    Authorization Type UHC, recheck FOTO status by visit 6    PT Start Time 1015    PT Stop Time 1101    PT Time Calculation (min) 46 min    Activity Tolerance Patient tolerated treatment well    Behavior During Therapy [facility] for tasks assessed/performed           Past Medical History:  Diagnosis Date  . Bell's palsy 2018    Past Surgical History:  Procedure Laterality Date  . ARTHROSCOPIC REPAIR ACL    . WISDOM TOOTH EXTRACTION      There were no vitals filed for this visit.    Subjective Assessment - 11/09/19 1126    Subjective Pt. is a 34 y/o male referred to PT for ongoing issues related to right-sided Bell's Palsy with associated facial weakness. Initial onset of symptoms was approximately 2 years ago associated with sinus and ear infection at the time. He has continued to have difficulty opening/closing his right eye and notes difficulty with facial expression/mouth movement with examples including difficulty smiling and tendency to favor left side with chewing.    Pertinent History 2 year history right-sided facial weakness associated with Bell's Palsy    Limitations Other (comment)   right-sided facial expression, chewing   Patient Stated Goals Get function back (of facial muscles)    Currently in Pain? No/denies              [facility] PT Assessment - 11/09/19 0001      Assessment   Medical Diagnosis Right sided Bell's Palsy    Referring Provider (PT) Shon Millet, DO    Onset Date/Surgical Date --   onset 2018   Prior Therapy  previous PT s/p ACL surgery years ago but no PT for current condition      Precautions   Precautions None      Restrictions   Weight Bearing Restrictions No      Balance Screen   Has the patient fallen in the past 6 months No      Cognition   Overall Cognitive Status Within Functional Limits for tasks assessed      Observation/Other Assessments   Observations Decreased ability to open eye on right side compared with left, asymmetry with mouth for facial expression and smile with decreased ROM on right side    Focus on Therapeutic Outcomes (FOTO)  37% limited      ROM / Strength   AROM / PROM / Strength AROM;Strength      AROM   Overall AROM Comments Bilateral shoulder AROM grossly WFL, right eye opening 1.0 cm, left eye 1.4 cm, mouth opening 6 cm with deviation on right side, lateral deviation symmetrical bilat.    AROM Assessment Site Cervical    Cervical Flexion 50    Cervical Extension 40    Cervical - Right Side Bend 35    Cervical - Left Side Bend 42    Cervical - Right Rotation 75    Cervical - Left Rotation 78  Strength   Overall Strength Comments Bilat. UE grossly 5/5      Palpation   Palpation comment tightness with trigger point right mastoid                       Objective measurements completed on examination: See above findings.       [facility] Adult PT Treatment/Exercise - 11/09/19 0001      Exercises   Exercises --   HEP hanodut review-facial exercises           Trigger Point Dry Needling - 11/09/19 0001    Consent Given? Yes    Education Handout Provided Yes    Muscles Treated Head and Neck Temporalis;Masseter;Lateral pterygoid   also right procerus, occipito-frontalis, orbicularis orbis   Dry Needling Comments needling for right-sided facial muscles with 32 gauge 30 mm needles excepting 32 gauge 05 mm needle for lateral pterygoid    Electrical Stimulation Performed with Dry Needling Yes    E-stim with Dry Needling Details TENS 2  pps x 10 minutes                PT Education - 11/09/19 1209    Education Details POC, HEP, dry needling, tx. options, FOTO patient report    Person(s) Educated Patient    Methods Explanation;Handout    Comprehension Verbalized understanding               PT Long Term Goals - 11/09/19 1215      PT LONG TERM GOAL #1   Title Independent with HEP    Baseline issued at eval    Time 6    Period Weeks    Status New    Target Date 12/21/19      PT LONG TERM GOAL #2   Title Improve FOTO outcome measure score to 25% or less impairment    Baseline 37% limited    Time 6    Period Weeks    Status New    Target Date 12/21/19      PT LONG TERM GOAL #3   Title Pt. to be able ot open right eye within 0.2 cm of left for improved ability facial expression    Baseline 0.4 cm difference    Time 6    Period Weeks    Status New    Target Date 12/21/19      PT LONG TERM GOAL #4   Title Pt. to be able to chew symmetrically with eating using both sides of mouth    Baseline favors left side    Time 6    Period Weeks    Status New    Target Date 12/21/19                  Plan - 11/09/19 1210    Clinical Impression Statement Pt. presents with 2 year history of right-sided facial weakness/impaired ability facial expression secondary to Bell's Palsy with decreased ability to open/close righ eye vs. left in addition to decreased ability facial expression with mouth movement and associated local muscle tightness/soft tissue restriction. Pt. would benefit from PT to help address current associated functional limitations but would consider fair potential given timeframe of symptom duration.    Personal Factors and Comorbidities Time since onset of injury/illness/exacerbation    Examination-Activity Limitations Self Feeding    Stability/Clinical Decision Making Stable/Uncomplicated    Clinical Decision Making Low    Rehab Potential Fair    PT Frequency --  1-2x/week   PT  Duration 6 weeks    PT Treatment/Interventions ADLs/Self Care Home Management;Electrical Stimulation;Therapeutic exercise;Patient/family education;Manual techniques;Dry needling;Therapeutic activities;Neuromuscular re-education    PT Next Visit Plan Check response dry needling and continue as found beneficial, review facial exercises for HEP prn, manual/STM to facial muscles to decrease soft tissu restriction    PT Home Exercise Plan facial expression exercise handout    Consulted and Agree with Plan of Care Patient           Patient will benefit from skilled therapeutic intervention in order to improve the following deficits and impairments:  Increased fascial restricitons, Decreased strength, Decreased range of motion  Visit Diagnosis: Facial weakness     Problem List Patient Active Problem List   Diagnosis Date Noted  . History of Bell's palsy 08/02/2019  . Healthcare maintenance 08/02/2019  . Insomnia 08/02/2019    Lazarus Gowda, PT, DPT 11/09/19 12:21 PM  [facility] [address] [place], [place], [address] Phone: [phone]   Fax:  [phone]  Name: William Newton MRN: [id number] Date of Birth: August 22, 1985

## 2019-11-09 NOTE — Patient Instructions (Signed)

## 2019-11-21 ENCOUNTER — Other Ambulatory Visit: Payer: Self-pay

## 2019-11-21 ENCOUNTER — Encounter: Payer: Self-pay | Admitting: Physical Therapy

## 2019-11-21 ENCOUNTER — Ambulatory Visit: Payer: 59 | Admitting: Physical Therapy

## 2019-11-21 DIAGNOSIS — R29810 Facial weakness: Secondary | ICD-10-CM | POA: Diagnosis not present

## 2019-11-21 NOTE — Therapy (Signed)
[facility]patient [facility] [address] [place], [place], [address] Phone: [phone]   Fax:  [phone]  Physical Therapy Treatment  Patient Details  Name: William Newton MRN: [id number] Date of Birth: 04/21/1985 Referring Provider (PT): Shon Millet, DO   Encounter Date: 11/21/2019   PT End of Session - 11/21/19 0905    Visit Number 2    Number of Visits 10    Date for PT Re-Evaluation 12/21/19    Authorization Type UHC, recheck FOTO status by visit 6    PT Start Time 0851    PT Stop Time 0926   time spent for dry needling and estim not included in timed minutes   PT Time Calculation (min) 35 min    Activity Tolerance Patient tolerated treatment well    Behavior During Therapy [facility] for tasks assessed/performed           Past Medical History:  Diagnosis Date  . Bell's palsy 2018    Past Surgical History:  Procedure Laterality Date  . ARTHROSCOPIC REPAIR ACL    . WISDOM TOOTH EXTRACTION      There were no vitals filed for this visit.   Subjective Assessment - 11/21/19 0902    Subjective No significant soreness after tx. at eval but has not noticed any improvement yet. He has been doing HEP for facial exercises twice per day.    Pertinent History 2 year history right-sided facial weakness associated with Bell's Palsy    Patient Stated Goals Get function back (of facial muscles)    Currently in Pain? No/denies                             [facility] Adult PT Treatment/Exercise - 11/21/19 0001      Exercises   Exercises --   brief HEP discussion/review facial ROM     Manual Therapy   Manual Therapy Soft tissue mobilization;Myofascial release    Soft tissue mobilization facial muscles-temporalis and masseter    Myofascial Release occipitofrontalis            Trigger Point Dry Needling - 11/21/19 0001    Consent Given? Yes    Education Handout Provided Previously provided    Muscles Treated Head and Neck  Temporalis;Masseter;Lateral pterygoid   also right procerus, occipito-frontalis, orbicularis orbis   Dry Needling Comments needling for right-sided facial muscles with 32 gauge 30 mm needles excepting 32 gauge 05 mm needle for lateral pterygoid    Electrical Stimulation Performed with Dry Needling Yes    E-stim with Dry Needling Details TENS 2 pps x 15 minutes                PT Education - 11/21/19 0905    Education Details POC, HEP    Person(s) Educated Patient    Methods Explanation    Comprehension Verbalized understanding               PT Long Term Goals - 11/09/19 1215      PT LONG TERM GOAL #1   Title Independent with HEP    Baseline issued at eval    Time 6    Period Weeks    Status New    Target Date 12/21/19      PT LONG TERM GOAL #2   Title Improve FOTO outcome measure score to 25% or less impairment    Baseline 37% limited    Time 6  Period Weeks    Status New    Target Date 12/21/19      PT LONG TERM GOAL #3   Title Pt. to be able ot open right eye within 0.2 cm of left for improved ability facial expression    Baseline 0.4 cm difference    Time 6    Period Weeks    Status New    Target Date 12/21/19      PT LONG TERM GOAL #4   Title Pt. to be able to chew symmetrically with eating using both sides of mouth    Baseline favors left side    Time 6    Period Weeks    Status New    Target Date 12/21/19                 Plan - 11/21/19 8101    Clinical Impression Statement At visit number 2 pt. continues with previous right-sided facial weakness issues without significant improvement yet-given symptom duration/onset x 2 year history expect potential progress will be gradual/take more time-will continue to monitor status for improvement but if tx. would help would expect to notice some change in status for improvement at least within the first 3-5 visits-for now plan continue POC pending potential progress.    Personal Factors and  Comorbidities Time since onset of injury/illness/exacerbation    Examination-Activity Limitations Self Feeding    Stability/Clinical Decision Making Stable/Uncomplicated    Clinical Decision Making Low    Rehab Potential Fair    PT Frequency --   1-2x/week   PT Duration 6 weeks    PT Treatment/Interventions ADLs/Self Care Home Management;Electrical Stimulation;Therapeutic exercise;Patient/family education;Manual techniques;Dry needling;Therapeutic activities;Neuromuscular re-education    PT Next Visit Plan continue dry needling, estim, manual, facial exercises/HEP review prn    PT Home Exercise Plan facial expression exercise handout    Consulted and Agree with Plan of Care Patient           Patient will benefit from skilled therapeutic intervention in order to improve the following deficits and impairments:  Increased fascial restricitons, Decreased strength, Decreased range of motion  Visit Diagnosis: Facial weakness     Problem List Patient Active Problem List   Diagnosis Date Noted  . History of Bell's palsy 08/02/2019  . Healthcare maintenance 08/02/2019  . Insomnia 08/02/2019    Lazarus Gowda, PT, DPT 11/21/19 9:27 AM  [facility] [address] [place], [place], [address] Phone: [phone]   Fax:  [phone]  Name: William Newton MRN: [id number] Date of Birth: 1986/01/10

## 2019-11-23 ENCOUNTER — Other Ambulatory Visit: Payer: Self-pay

## 2019-11-23 ENCOUNTER — Encounter: Payer: Self-pay | Admitting: Physical Therapy

## 2019-11-23 ENCOUNTER — Ambulatory Visit: Payer: 59 | Attending: Neurology | Admitting: Physical Therapy

## 2019-11-23 DIAGNOSIS — R29810 Facial weakness: Secondary | ICD-10-CM | POA: Insufficient documentation

## 2019-11-23 NOTE — Therapy (Signed)
[facility] [address] [place], [place], [address] Phone: [phone]   Fax:  [phone]  Physical Therapy Treatment  Patient Details  Name: William Newton MRN: [id number] Date of Birth: [birth year] Referring Provider (PT): Shon Millet, DO   Encounter Date: 11/23/2019   PT End of Session - 11/23/19 1104    Visit Number 3    Number of Visits 10    Date for PT Re-Evaluation 12/21/19    Authorization Type [employer], recheck FOTO status by visit 6    PT Start Time 1025   arrived a few minutes late   PT Stop Time 1100    PT Time Calculation (min) 35 min    Activity Tolerance Patient tolerated treatment well    Behavior During Therapy [facility] for tasks assessed/performed           Past Medical History:  Diagnosis Date  . Bell's palsy 2018    Past Surgical History:  Procedure Laterality Date  . ARTHROSCOPIC REPAIR ACL    . WISDOM TOOTH EXTRACTION      There were no vitals filed for this visit.   Subjective Assessment - 11/23/19 1103    Subjective Pt. noting some new onset right-sided TMJ/masseter region soreness otherwise no significant changes since last visit.    Pertinent History 2 year history right-sided facial weakness associated with Bell's Palsy                             [facility] Adult PT Treatment/Exercise - 11/23/19 0001      Manual Therapy   Soft tissue mobilization facial muscles-temporalis and masseter    Myofascial Release occipitofrontalis            Trigger Point Dry Needling - 11/23/19 0001    Consent Given? Yes    Education Handout Provided Previously provided    Muscles Treated Head and Neck Temporalis;Masseter;Lateral pterygoid   also right procerus, occipito-frontalis, orbicularis orbis   Dry Needling Comments needling for right-sided facial muscles with 32 gauge 30 mm needles excepting 32 gauge 05 mm needle for lateral pterygoid    Electrical Stimulation Performed with Dry Needling  Yes    E-stim with Dry Needling Details TENS 2 pps x 15 minutes                PT Education - 11/23/19 1104    Education Details POC    Person(s) Educated Patient    Methods Explanation    Comprehension Verbalized understanding               PT Long Term Goals - 11/09/19 1215      PT LONG TERM GOAL #1   Title Independent with HEP    Baseline issued at eval    Time 6    Period Weeks    Status New    Target Date 12/21/19      PT LONG TERM GOAL #2   Title Improve FOTO outcome measure score to 25% or less impairment    Baseline 37% limited    Time 6    Period Weeks    Status New    Target Date 12/21/19      PT LONG TERM GOAL #3   Title Pt. to be able ot open right eye within 0.2 cm of left for improved ability facial expression    Baseline 0.4 cm difference    Time 6    Period Weeks  Status New    Target Date 12/21/19      PT LONG TERM GOAL #4   Title Pt. to be able to chew symmetrically with eating using both sides of mouth    Baseline favors left side    Time 6    Period Weeks    Status New    Target Date 12/21/19                 Plan - 11/23/19 1105    Clinical Impression Statement More focus with manual to masseter region given soreness as noted in subjective. Limited progress otherwise for improvement with right-sided facial symptoms from Bell's Palsy-discussed with patient of no improvement after the next 2 sessions consider d/c.    Personal Factors and Comorbidities Time since onset of injury/illness/exacerbation    Examination-Activity Limitations Self Feeding    Stability/Clinical Decision Making Stable/Uncomplicated    Clinical Decision Making Low    Rehab Potential Fair    PT Frequency --   1-2x/week   PT Duration 6 weeks    PT Treatment/Interventions ADLs/Self Care Home Management;Electrical Stimulation;Therapeutic exercise;Patient/family education;Manual techniques;Dry needling;Therapeutic activities;Neuromuscular re-education     PT Next Visit Plan continue dry needling, estim, manual, facial exercises/HEP review prn    PT Home Exercise Plan facial expression exercise handout    Consulted and Agree with Plan of Care Patient           Patient will benefit from skilled therapeutic intervention in order to improve the following deficits and impairments:  Increased fascial restricitons, Decreased strength, Decreased range of motion  Visit Diagnosis: Facial weakness     Problem List Patient Active Problem List   Diagnosis Date Noted  . History of Bell's palsy 08/02/2019  . Healthcare maintenance 08/02/2019  . Insomnia 08/02/2019    Lazarus Gowda, PT, DPT 11/23/19 11:10 AM  [facility] [address] [place], [place], [address] Phone: [phone]   Fax:  [phone]  Name: William Newton MRN: [id number] Date of Birth: June 14, 1985

## 2019-11-28 ENCOUNTER — Ambulatory Visit: Payer: 59 | Admitting: Physical Therapy

## 2019-11-30 ENCOUNTER — Encounter: Payer: Self-pay | Admitting: Physical Therapy

## 2019-11-30 ENCOUNTER — Other Ambulatory Visit: Payer: Self-pay

## 2019-11-30 ENCOUNTER — Ambulatory Visit: Payer: 59 | Admitting: Physical Therapy

## 2019-11-30 DIAGNOSIS — R29810 Facial weakness: Secondary | ICD-10-CM | POA: Diagnosis not present

## 2019-11-30 NOTE — Therapy (Signed)
[place], [place], [address] Phone: [phone]   Fax:  [phone]  Physical Therapy Treatment  Patient Details  Name: William Newton MRN: [id number] Date of Birth: Sep 03, 1985 Referring Provider (PT): Metta Clines, DO   Encounter Date: 11/30/2019   PT End of Session - 11/30/19 1353    Visit Number 4    Number of Visits 10    Date for PT Re-Evaluation 12/21/19    Authorization Type UHC, recheck FOTO status by visit 6    PT Start Time 1340   arrived a few minutes late   PT Stop Time 1412    PT Time Calculation (min) 32 min    Activity Tolerance Patient tolerated treatment well    Behavior During Therapy [facility] for tasks assessed/performed           Past Medical History:  Diagnosis Date  . Bell's palsy 2018    Past Surgical History:  Procedure Laterality Date  . ARTHROSCOPIC REPAIR ACL    . WISDOM TOOTH EXTRACTION      There were no vitals filed for this visit.   Subjective Assessment - 11/30/19 1352    Subjective Pt. returns for 4th therapy visit-had to cancel last session due to work schedule wih out of town travel. Still no change in symptoms or improvement-see plan.    Pertinent History 2 year history right-sided facial weakness associated with Bell's Palsy    Currently in Pain? No/denies                             [facility] Adult PT Treatment/Exercise - 11/30/19 0001      Manual Therapy   Soft tissue mobilization facial muscles-temporalis and masseter    Myofascial Release occipitofrontalis            Trigger Point Dry Needling - 11/30/19 0001    Consent Given? Yes    Education Handout Provided Previously provided    Muscles Treated Head and Neck Temporalis;Masseter;Lateral pterygoid   also right procerus, occipito-frontalis, orbicularis orbis   Dry Needling Comments needling for right-sided facial muscles with 32 gauge 30 mm needles excepting 32 gauge 05 mm needle for  lateral pterygoid    Electrical Stimulation Performed with Dry Needling Yes    E-stim with Dry Needling Details TENS 2 pps x 15 minutes                     PT Long Term Goals - 11/30/19 1357      PT LONG TERM GOAL #1   Title Independent with HEP    Baseline met    Time 6    Period Weeks    Status Achieved      PT LONG TERM GOAL #2   Title Improve FOTO outcome measure score to 25% or less impairment    Baseline 37% limited    Time 6    Period Weeks    Status On-going      PT LONG TERM GOAL #3   Title Pt. to be able ot open right eye within 0.2 cm of left for improved ability facial expression    Baseline 0.4 cm difference    Time 6    Period Weeks    Status On-going      PT LONG TERM GOAL #4   Title Pt. to be able to chew symmetrically with eating using both sides of mouth  Baseline favors left side    Time 6    Period Weeks    Status On-going                 Plan - 11/30/19 1353    Clinical Impression Statement At visit 4 still no improvement noted with therapy efforts to date. Given 2 year history of symptoms expect progress would take time but if tx. could potentially help would expect to see at least some level of symptoms improvement within 4-5 visits. No further therapy visits currently scheduled. Plan for pt. to see how things go after today's session and if still no improvement then would plan tentative d/c. If improvement noted then will leave chart open to call to schedule more visits and would plan continue therapy.    Personal Factors and Comorbidities Time since onset of injury/illness/exacerbation    Examination-Activity Limitations Self Feeding    Stability/Clinical Decision Making Stable/Uncomplicated    Clinical Decision Making Low    Rehab Potential Fair    PT Frequency --   1-2x/week   PT Duration 6 weeks    PT Treatment/Interventions ADLs/Self Care Home Management;Electrical Stimulation;Therapeutic exercise;Patient/family  education;Manual techniques;Dry needling;Therapeutic activities;Neuromuscular re-education    PT Next Visit Plan await status after today's visit-if no improvement then d/c, if noting progress then resume/continue POC    PT Home Exercise Plan facial expression exercise handout    Consulted and Agree with Plan of Care Patient           Patient will benefit from skilled therapeutic intervention in order to improve the following deficits and impairments:  Increased fascial restricitons, Decreased strength, Decreased range of motion  Visit Diagnosis: Facial weakness     Problem List Patient Active Problem List   Diagnosis Date Noted  . History of Bell's palsy 08/02/2019  . Healthcare maintenance 08/02/2019  . Insomnia 08/02/2019    Beaulah Dinning, PT, DPT 11/30/19 2:16 PM  [facility] [address] [place], [place], [address] Phone: [phone]   Fax:  [phone]  Name: William Newton MRN: [id number] Date of Birth: Dec 11, 1985

## 2020-02-02 NOTE — Therapy (Signed)
[place], [place], [address] Phone: [phone]   Fax:  [phone]  Physical Therapy Treatment/Discharge  Patient Details  Name: William Newton MRN: [id number] Date of Birth: 1986/03/02 Referring Provider (PT): Metta Clines, DO   Encounter Date: 11/30/2019    Past Medical History:  Diagnosis Date  . Bell's palsy 2018    Past Surgical History:  Procedure Laterality Date  . ARTHROSCOPIC REPAIR ACL    . WISDOM TOOTH EXTRACTION      There were no vitals filed for this visit.                                   PT Long Term Goals - 11/30/19 1357      PT LONG TERM GOAL #1   Title Independent with HEP    Baseline met    Time 6    Period Weeks    Status Achieved      PT LONG TERM GOAL #2   Title Improve FOTO outcome measure score to 25% or less impairment    Baseline 37% limited    Time 6    Period Weeks    Status On-going      PT LONG TERM GOAL #3   Title Pt. to be able ot open right eye within 0.2 cm of left for improved ability facial expression    Baseline 0.4 cm difference    Time 6    Period Weeks    Status On-going      PT LONG TERM GOAL #4   Title Pt. to be able to chew symmetrically with eating using both sides of mouth    Baseline favors left side    Time 6    Period Weeks    Status On-going                  Patient will benefit from skilled therapeutic intervention in order to improve the following deficits and impairments:  Increased fascial restricitons, Decreased strength, Decreased range of motion  Visit Diagnosis: Facial weakness     Problem List Patient Active Problem List   Diagnosis Date Noted  . History of Bell's palsy 08/02/2019  . Healthcare maintenance 08/02/2019  . Insomnia 08/02/2019       PHYSICAL THERAPY DISCHARGE SUMMARY  Visits from Start of Care: 4  Current functional level related to goals /  functional outcomes: Patient attended 4 therapy visits but no significant improvement or changes in status noted.   Remaining deficits: Facial weakness/paralysis   Education / Equipment: HEP Plan: Patient agrees to discharge.  Patient goals were not met. Patient is being discharged due to lack of progress.  ?????          Beaulah Dinning, PT, DPT 02/02/20 12:55 PM       [facility] [address] [place], [place], [address] Phone: [phone]   Fax:  [phone]  Name: William Newton MRN: [id number] Date of Birth: 03-11-1986

## 2020-08-02 ENCOUNTER — Encounter: Payer: 59 | Admitting: Family Medicine

## 2021-11-10 IMAGING — CT CT MAXILLOFACIAL W/O CM
3 of 5 series · 13 of 47 positions shown, 15 images · non-contrast
Comparison: None.

CLINICAL DATA: 33-year-old male with chronic sinusitis, congestion,
headaches. Sinonasal polyposis.

EXAM:
CT MAXILLOFACIAL WITHOUT CONTRAST
TECHNIQUE: Multidetector CT images of the paranasal sinuses were obtained using
the standard protocol without intravenous contrast.

[Series 4: sinus 2.00 hr60 s3 cor · coronal · 0.25mm/px · 3 of 102 slices shown]
[im 34/102  bone]
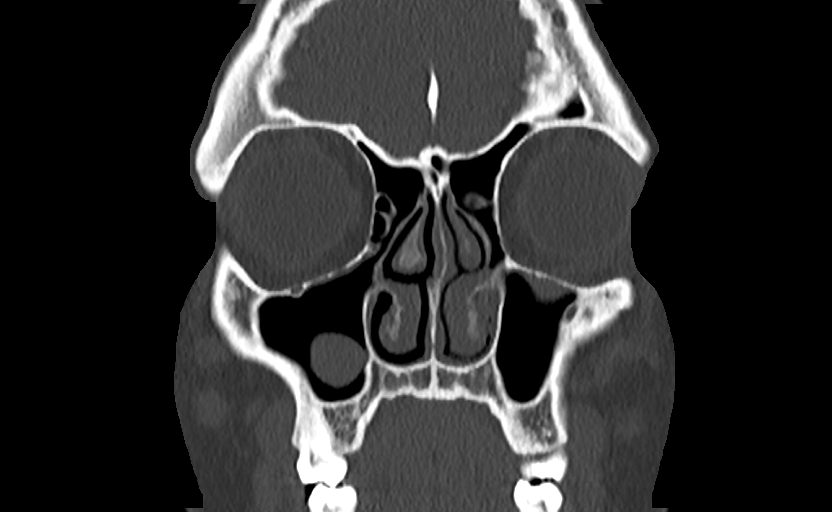
[im 45/102  bone]
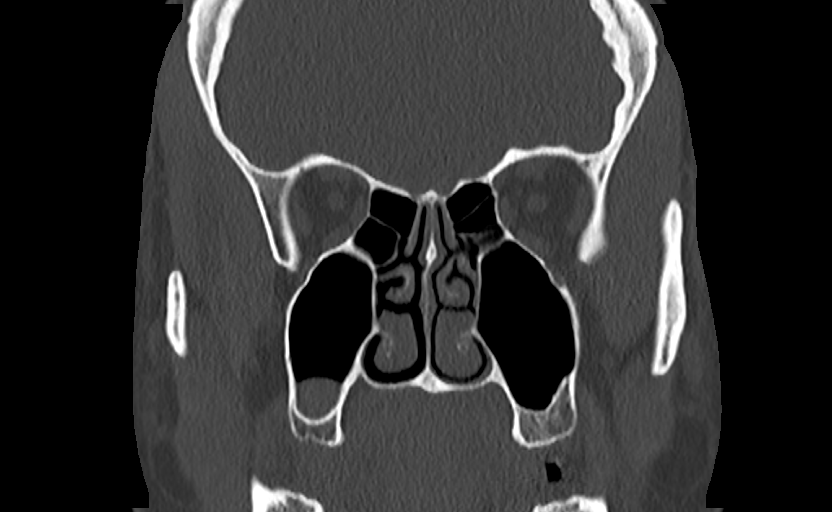
[im 57/102  bone]
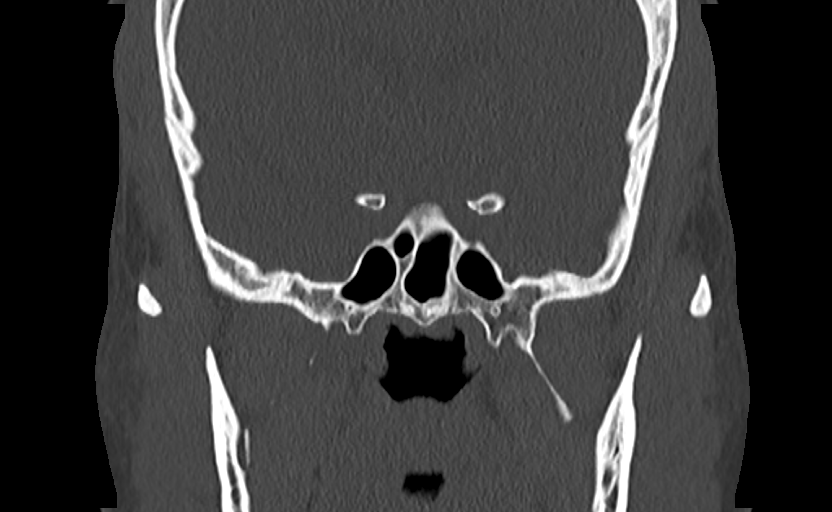

[Series 6: sinus 2.00 hr60 s3 sag · sagittal · 0.25mm/px · 3 of 102 slices shown]
[im 34/102  bone]
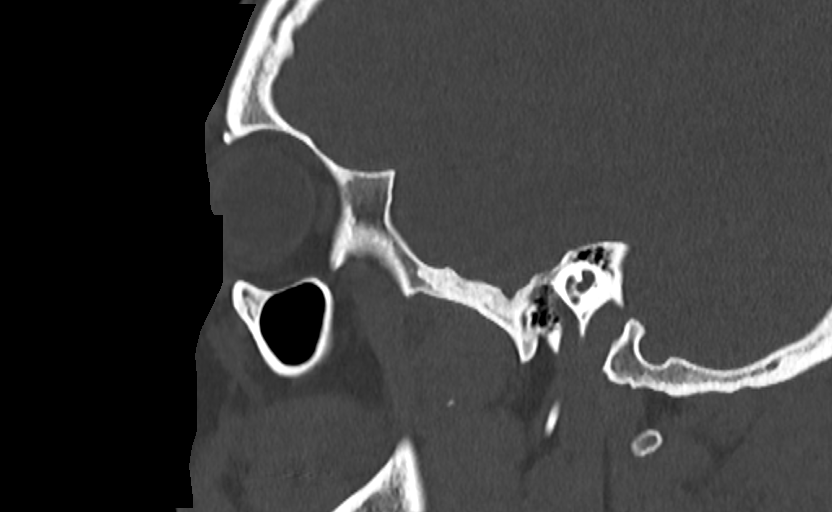
[im 51/102  bone]
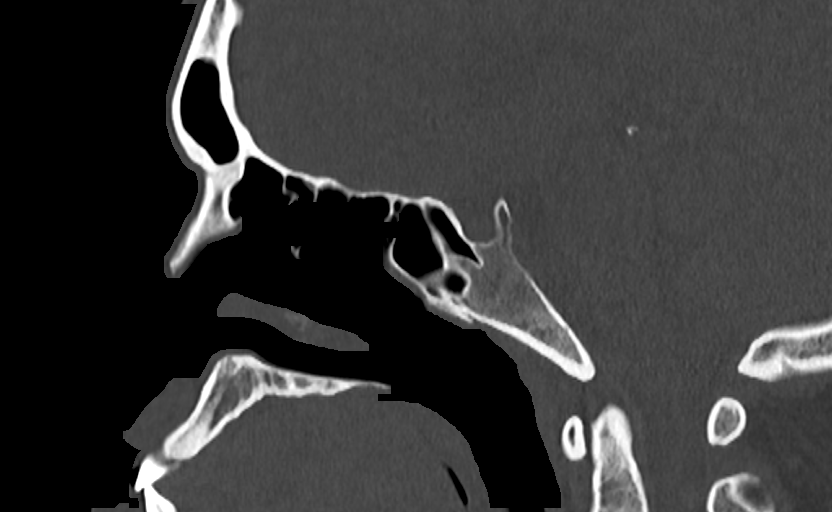
[im 68/102  bone]
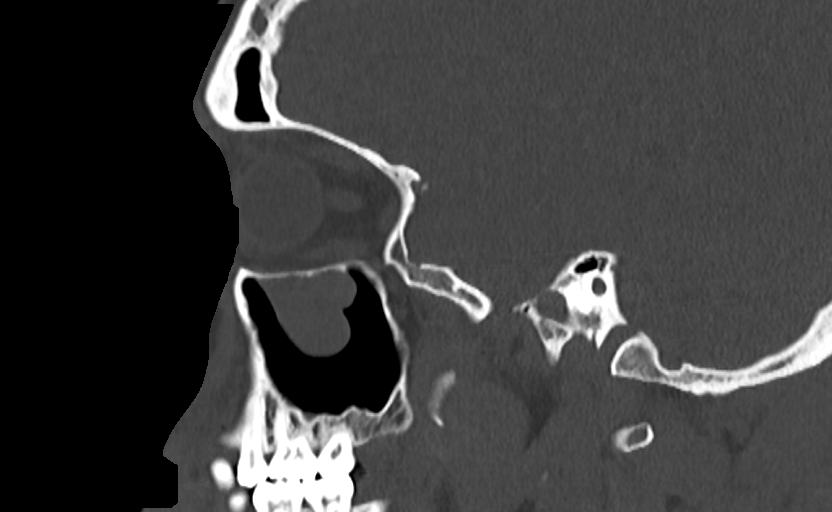

[Series 12: sinus 1.00 hr60 s3 axial fusion thins · axial · 0.40mm/px · z∈[-636,-539]mm · 7 of 210 slices shown, 9 images]
[im 24/210  brain]
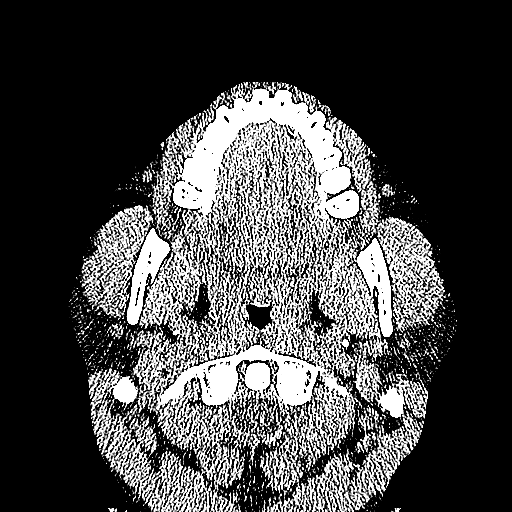
[im 24/210  bone]
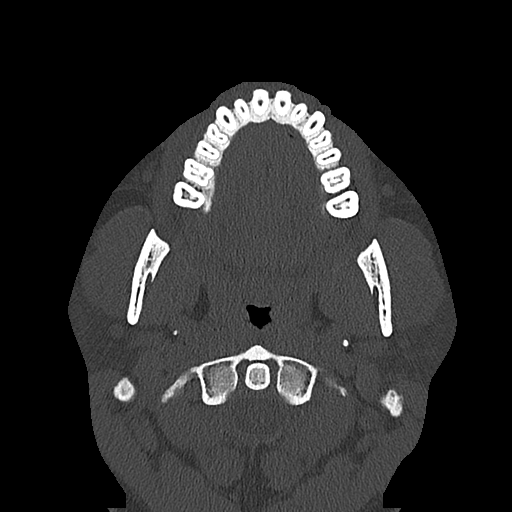
[im 47/210  bone]
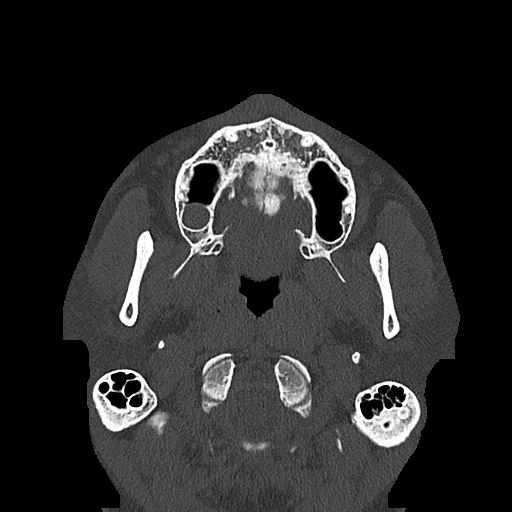
[im 82/210  bone]
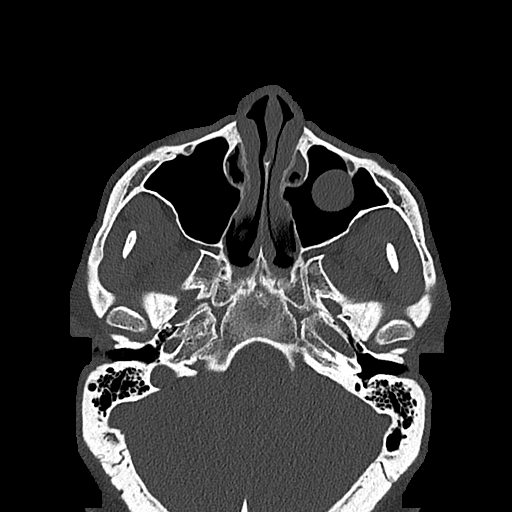
[im 105/210  bone]
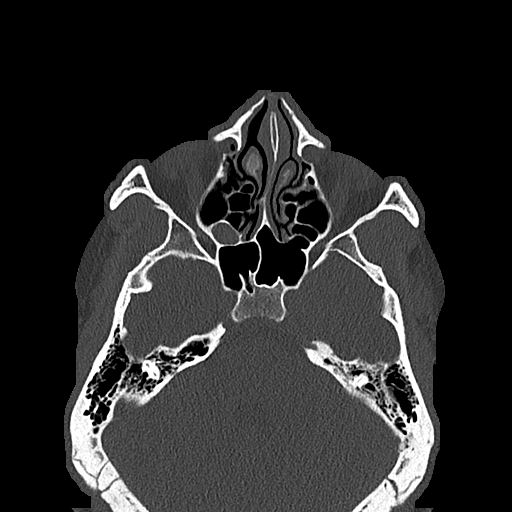
[im 128/210  brain]
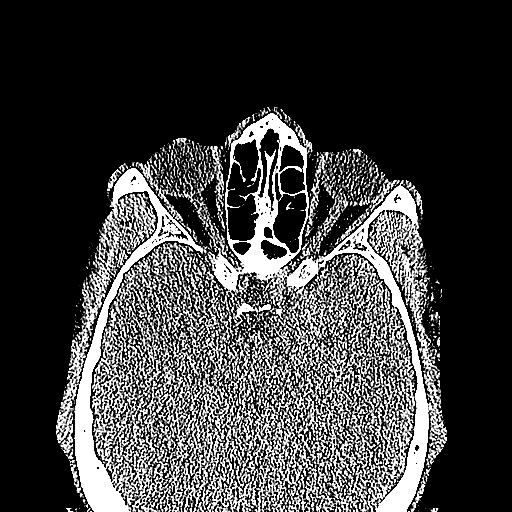
[im 128/210  bone]
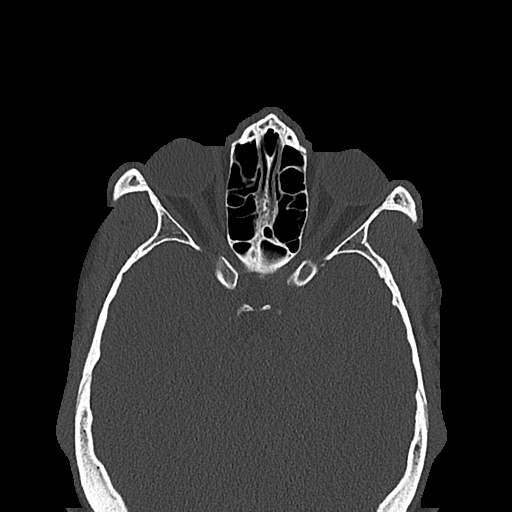
[im 163/210  bone]
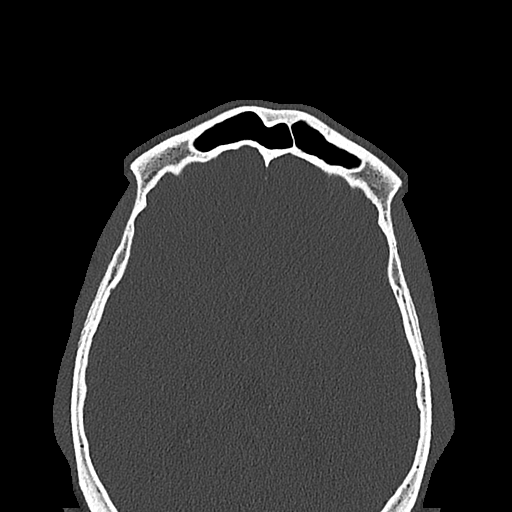
[im 186/210  bone]
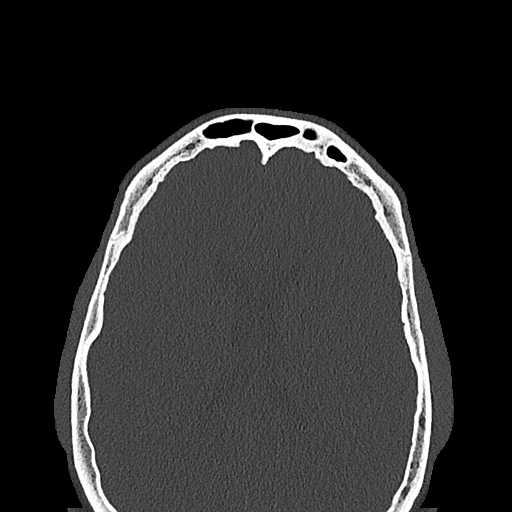

[13 of 47 positions shown; findings below may reference images not displayed]

FINDINGS: Paranasal sinuses:

Frontal: Mildly hyperplastic. Normally aerated. Patent frontal sinus
drainage pathways.

Ethmoid: Mildly hyperplastic. Normally aerated aside from a
posterior air cell on the right (series 2, image 31).

Maxillary: Scattered bilateral polypoid opacity most compatible with
multiple mucous retention cysts (coronal image 43).

Sphenoid: Mildly hyperplastic. Normally aerated. Patent
sphenoethmoidal recesses.

Right ostiomeatal unit: Patent (coronal image 34).

Left ostiomeatal unit: Narrow due to mucosal thickening (coronal
image 36), although with apparent accessory drainage on image 40.

Nasal passages: Intact nasal septum with mild leftward anterior and
rightward posterior septal deviation. Somewhat symmetric bilateral
nasal cavity mucosal thickening. Slightly polypoid appearance of the
posteroinferior turbinates (coronal image 52), but otherwise no
nasal cavity polyp. No retained secretions. Olfactory recesses are
clear.

Anatomy:

Anterior ethmoidal artery position suspected on coronal images 39
and 40 with pneumatization superior to both notches although much
more pronounced on the left.

Keros type 2 olfactory fossa.

Primarily Presellar sphenoid pneumatization pattern. However,
hyperplastic sphenoids with marginal clinoid process pneumatization
(coronal image 54).

There appears to be variant anatomy of the medial wall of the left
maxillary sinus with accessory drainage on coronal image 40.

Other: Negative visible noncontrast brain parenchyma. Visualized
orbits and scalp soft tissues are within normal limits. Negative
visible noncontrast deep soft tissue spaces of the face.

Bilateral tympanic cavities and mastoids are clear. No acute
maxillary dental finding. No acute osseous abnormality identified.
IMPRESSION: 1. Mildly hyperplastic and generally well aerated paranasal sinuses.
Multiple polypoid opacities in both maxillary sinuses compatible
with retention cysts.

2. Narrowing of the left OMC but with superimposed accessory
drainage pathway suspected on coronal image 40.

3. Somewhat symmetric nasal cavity mucosal thickening raising the
possibility of rhinitis. No definite nasal cavity polyposis. Mild
nasal septal deviation.

## 2022-10-30 ENCOUNTER — Telehealth: Payer: Self-pay | Admitting: Family Medicine

## 2022-10-30 NOTE — Telephone Encounter (Signed)
Patient has not been seen by current PCP in a year. Reached out to the patient to see if they have a new PCP or if they would like to continue care with the provider.  LVM to schedule
